# Patient Record
Sex: Female | Born: 1935 | Race: White | Hispanic: No | Marital: Married
Health system: Southern US, Community
[De-identification: ages and names within clinical notes are randomized; demographics above are authoritative.]

---

## 2004-02-22 ENCOUNTER — Inpatient Hospital Stay (HOSPITAL_COMMUNITY): Admission: RE | Admit: 2004-02-22 | Discharge: 2004-02-27 | Payer: Self-pay | Admitting: Neurosurgery

## 2004-08-12 ENCOUNTER — Ambulatory Visit (HOSPITAL_COMMUNITY): Admission: RE | Admit: 2004-08-12 | Discharge: 2004-08-12 | Payer: Self-pay | Admitting: Neurosurgery

## 2012-08-15 DEATH — deceased

## 2017-09-13 DIAGNOSIS — N3000 Acute cystitis without hematuria: Secondary | ICD-10-CM | POA: Insufficient documentation

## 2017-12-19 ENCOUNTER — Encounter: Payer: Self-pay | Admitting: Internal Medicine

## 2018-03-04 NOTE — Progress Notes (Signed)
This encounter was created in error - please disregard.
